# Patient Record
Sex: Male | Born: 1995 | Race: Black or African American | Hispanic: No | Marital: Single | Smoking: Never smoker
Health system: Southern US, Community
[De-identification: ages and names within clinical notes are randomized; demographics above are authoritative.]

## PROBLEM LIST (undated history)

## (undated) ENCOUNTER — Emergency Department: Admission: EM | Discharge status: Absent without leave | Payer: Self-pay

## (undated) ENCOUNTER — Emergency Department (HOSPITAL_COMMUNITY): Admission: EM | Payer: Self-pay

---

## 2015-07-18 ENCOUNTER — Encounter (HOSPITAL_COMMUNITY): Payer: Self-pay | Admitting: *Deleted

## 2015-07-18 ENCOUNTER — Emergency Department (HOSPITAL_COMMUNITY)
Admission: EM | Admit: 2015-07-18 | Discharge: 2015-07-18 | Disposition: A | Discharge status: Home / Self Care | Payer: No Typology Code available for payment source | Attending: Emergency Medicine | Admitting: Emergency Medicine

## 2015-07-18 DIAGNOSIS — Y998 Other external cause status: Secondary | ICD-10-CM | POA: Diagnosis not present

## 2015-07-18 DIAGNOSIS — S199XXA Unspecified injury of neck, initial encounter: Secondary | ICD-10-CM | POA: Diagnosis not present

## 2015-07-18 DIAGNOSIS — Y92410 Unspecified street and highway as the place of occurrence of the external cause: Secondary | ICD-10-CM | POA: Insufficient documentation

## 2015-07-18 DIAGNOSIS — Y9389 Activity, other specified: Secondary | ICD-10-CM | POA: Insufficient documentation

## 2015-07-18 DIAGNOSIS — S39012A Strain of muscle, fascia and tendon of lower back, initial encounter: Secondary | ICD-10-CM | POA: Diagnosis not present

## 2015-07-18 DIAGNOSIS — S3992XA Unspecified injury of lower back, initial encounter: Secondary | ICD-10-CM | POA: Diagnosis present

## 2015-07-18 DIAGNOSIS — S29012A Strain of muscle and tendon of back wall of thorax, initial encounter: Secondary | ICD-10-CM

## 2015-07-18 MED ORDER — IBUPROFEN 600 MG PO TABS: 600.0000 mg | ORAL_TABLET | Freq: Four times a day (QID) | ORAL | Status: AC | PRN

## 2015-07-18 MED ORDER — CYCLOBENZAPRINE HCL 10 MG PO TABS: 10.0000 mg | ORAL_TABLET | Freq: Two times a day (BID) | ORAL | Status: AC | PRN
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
## 2018-05-05 ENCOUNTER — Other Ambulatory Visit: Payer: Self-pay | Admitting: Internal Medicine

## 2018-05-05 ENCOUNTER — Ambulatory Visit
Admission: RE | Admit: 2018-05-05 | Discharge: 2018-05-05 | Disposition: A | Discharge status: Home / Self Care | Payer: No Typology Code available for payment source | Source: Ambulatory Visit | Attending: Internal Medicine | Admitting: Internal Medicine

## 2018-05-05 DIAGNOSIS — Z111 Encounter for screening for respiratory tuberculosis: Secondary | ICD-10-CM

## 2019-04-02 IMAGING — CR DG CHEST 1V
1 series · 1 of 1 positions shown · non-contrast
Comparison: None.

CLINICAL DATA: Positive PPD.

EXAM:
CHEST  1 VIEW

[w chest pa]
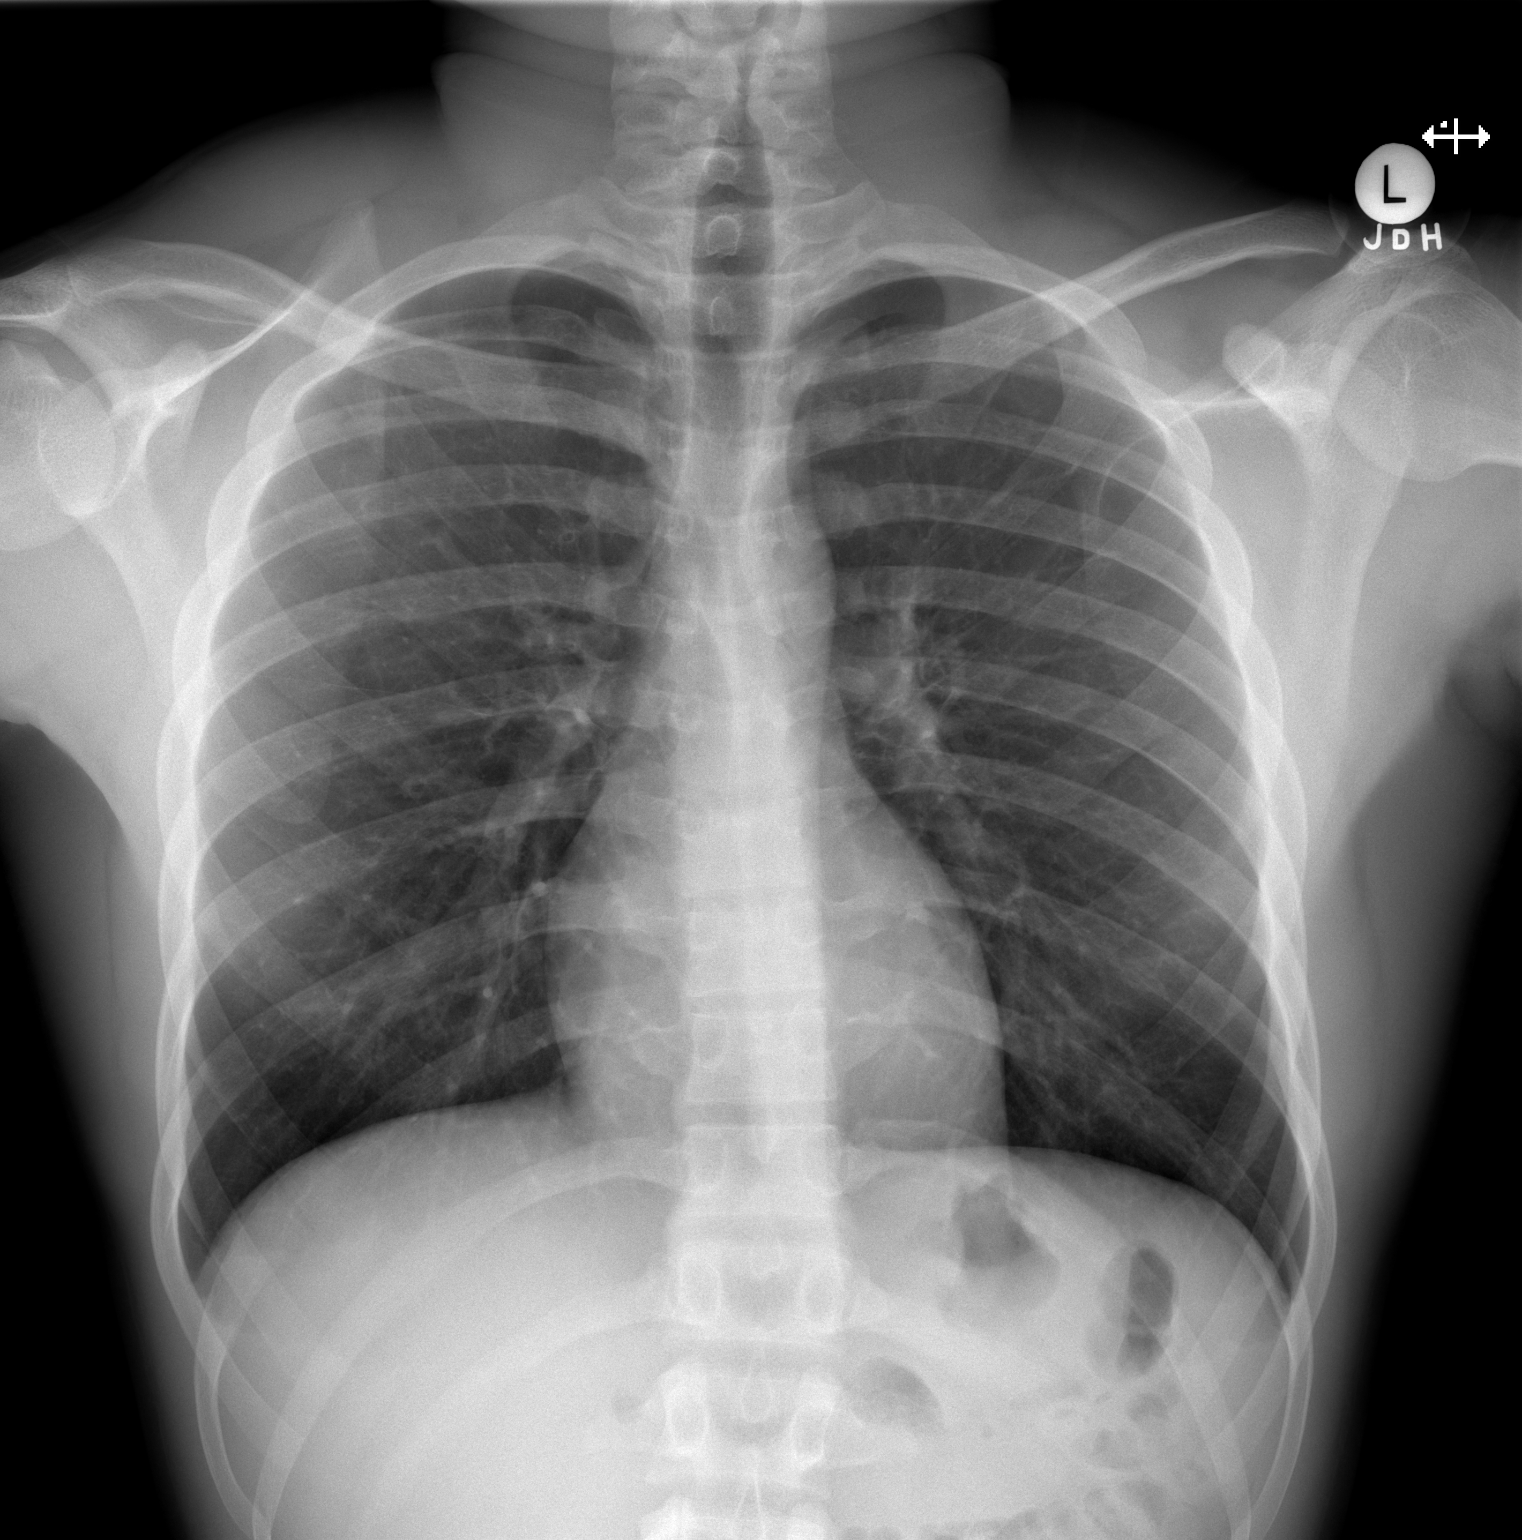

[1 of 1 positions shown; findings below may reference images not displayed]

FINDINGS: The heart size and mediastinal contours are within normal limits.
Both lungs are clear. The visualized skeletal structures are
unremarkable.
IMPRESSION: No active disease.

## 2019-12-24 ENCOUNTER — Ambulatory Visit: Payer: Self-pay | Attending: Family

## 2019-12-24 DIAGNOSIS — Z23 Encounter for immunization: Secondary | ICD-10-CM

## 2019-12-24 NOTE — Progress Notes (Signed)
   Covid-19 Vaccination Clinic  Name:  Richard Norton    MRN: [id number] DOB: [birth year]  12/24/2019  [name] was observed post Covid-19 immunization for 15 minutes without incident. He was provided with Vaccine Information Sheet and instruction to access the V-Safe system.   [name] was instructed to call 911 with any severe reactions post vaccine: Marland Kitchen Difficulty breathing  . Swelling of face and throat  . A fast heartbeat  . A bad rash all over body  . Dizziness and weakness   Immunizations Administered    Name Date Dose VIS Date Route   Moderna COVID-19 Vaccine 12/24/2019 10:47 AM 0.5 mL 08/25/2019 Intramuscular   Manufacturer: Moderna   Lot: [id number]   NDC: 61537-943-27

## 2020-01-26 ENCOUNTER — Ambulatory Visit: Payer: Self-pay | Attending: Family

## 2020-01-26 DIAGNOSIS — Z23 Encounter for immunization: Secondary | ICD-10-CM

## 2020-01-26 NOTE — Progress Notes (Signed)
   Covid-19 Vaccination Clinic  Name:  Richard Norton    MRN: [id number] DOB: 04/08/1996  01/26/2020  [name] was observed post Covid-19 immunization for 15 minutes without incident. He was provided with Vaccine Information Sheet and instruction to access the V-Safe system.   [name] was instructed to call 911 with any severe reactions post vaccine: Marland Kitchen Difficulty breathing  . Swelling of face and throat  . A fast heartbeat  . A bad rash all over body  . Dizziness and weakness   Immunizations Administered    Name Date Dose VIS Date Route   Moderna COVID-19 Vaccine 01/26/2020 10:42 AM 0.5 mL 08/2019 Intramuscular   Manufacturer: Moderna   Lot: [id number]   NDC: 12162-446-95
